# Patient Record
Sex: Male | Born: 1950
Health system: Southern US, Community
[De-identification: ages and names within clinical notes are randomized; demographics above are authoritative.]

## PROBLEM LIST (undated history)

## (undated) DIAGNOSIS — J302 Other seasonal allergic rhinitis: Secondary | ICD-10-CM

## (undated) DIAGNOSIS — H269 Unspecified cataract: Secondary | ICD-10-CM

## (undated) HISTORY — DX: Unspecified cataract: H26.9

## (undated) HISTORY — DX: Other seasonal allergic rhinitis: J30.2

---

## 2004-11-21 ENCOUNTER — Ambulatory Visit: Payer: Self-pay | Admitting: Family Medicine

## 2005-03-23 ENCOUNTER — Ambulatory Visit: Payer: Self-pay | Admitting: Family Medicine

## 2005-10-18 ENCOUNTER — Ambulatory Visit: Payer: Self-pay | Admitting: Family Medicine

## 2005-12-03 ENCOUNTER — Ambulatory Visit: Payer: Self-pay | Admitting: Family Medicine

## 2005-12-10 ENCOUNTER — Ambulatory Visit: Payer: Self-pay | Admitting: Family Medicine

## 2006-01-18 ENCOUNTER — Ambulatory Visit: Payer: Self-pay | Admitting: Family Medicine

## 2006-10-23 ENCOUNTER — Ambulatory Visit: Payer: Self-pay | Admitting: Family Medicine

## 2008-02-06 ENCOUNTER — Ambulatory Visit: Payer: Self-pay | Admitting: Gastroenterology

## 2008-02-18 ENCOUNTER — Ambulatory Visit: Payer: Self-pay | Admitting: Gastroenterology

## 2016-02-01 DIAGNOSIS — H524 Presbyopia: Secondary | ICD-10-CM | POA: Diagnosis not present

## 2016-02-01 DIAGNOSIS — H2511 Age-related nuclear cataract, right eye: Secondary | ICD-10-CM | POA: Diagnosis not present

## 2016-02-01 DIAGNOSIS — H2512 Age-related nuclear cataract, left eye: Secondary | ICD-10-CM | POA: Diagnosis not present

## 2016-02-01 DIAGNOSIS — H25011 Cortical age-related cataract, right eye: Secondary | ICD-10-CM | POA: Diagnosis not present

## 2016-02-01 DIAGNOSIS — D3132 Benign neoplasm of left choroid: Secondary | ICD-10-CM | POA: Diagnosis not present

## 2016-02-01 DIAGNOSIS — H25012 Cortical age-related cataract, left eye: Secondary | ICD-10-CM | POA: Diagnosis not present

## 2016-02-13 DIAGNOSIS — R7301 Impaired fasting glucose: Secondary | ICD-10-CM | POA: Diagnosis not present

## 2016-02-13 DIAGNOSIS — E784 Other hyperlipidemia: Secondary | ICD-10-CM | POA: Diagnosis not present

## 2016-02-13 DIAGNOSIS — Z125 Encounter for screening for malignant neoplasm of prostate: Secondary | ICD-10-CM | POA: Diagnosis not present

## 2016-02-14 DIAGNOSIS — H2511 Age-related nuclear cataract, right eye: Secondary | ICD-10-CM | POA: Diagnosis not present

## 2016-02-14 DIAGNOSIS — H2512 Age-related nuclear cataract, left eye: Secondary | ICD-10-CM | POA: Diagnosis not present

## 2016-02-21 ENCOUNTER — Other Ambulatory Visit: Payer: Self-pay | Admitting: Internal Medicine

## 2016-02-21 DIAGNOSIS — Z0000 Encounter for general adult medical examination without abnormal findings: Secondary | ICD-10-CM | POA: Diagnosis not present

## 2016-02-21 DIAGNOSIS — Z139 Encounter for screening, unspecified: Secondary | ICD-10-CM

## 2016-02-21 DIAGNOSIS — E784 Other hyperlipidemia: Secondary | ICD-10-CM | POA: Diagnosis not present

## 2016-02-21 DIAGNOSIS — F17200 Nicotine dependence, unspecified, uncomplicated: Secondary | ICD-10-CM | POA: Diagnosis not present

## 2016-02-21 DIAGNOSIS — N529 Male erectile dysfunction, unspecified: Secondary | ICD-10-CM | POA: Diagnosis not present

## 2016-02-21 DIAGNOSIS — Z6828 Body mass index (BMI) 28.0-28.9, adult: Secondary | ICD-10-CM | POA: Diagnosis not present

## 2016-02-21 DIAGNOSIS — Z23 Encounter for immunization: Secondary | ICD-10-CM | POA: Diagnosis not present

## 2016-02-21 DIAGNOSIS — H02409 Unspecified ptosis of unspecified eyelid: Secondary | ICD-10-CM | POA: Diagnosis not present

## 2016-02-21 DIAGNOSIS — R7301 Impaired fasting glucose: Secondary | ICD-10-CM | POA: Diagnosis not present

## 2016-02-21 DIAGNOSIS — Z1389 Encounter for screening for other disorder: Secondary | ICD-10-CM | POA: Diagnosis not present

## 2016-02-23 DIAGNOSIS — Z1212 Encounter for screening for malignant neoplasm of rectum: Secondary | ICD-10-CM | POA: Diagnosis not present

## 2016-02-27 ENCOUNTER — Encounter: Payer: Self-pay | Admitting: Gastroenterology

## 2016-03-02 ENCOUNTER — Ambulatory Visit
Admission: RE | Admit: 2016-03-02 | Discharge: 2016-03-02 | Disposition: A | Discharge status: Home / Self Care | Payer: PPO | Source: Ambulatory Visit | Attending: Internal Medicine | Admitting: Internal Medicine

## 2016-03-02 DIAGNOSIS — Z139 Encounter for screening, unspecified: Secondary | ICD-10-CM

## 2016-03-02 DIAGNOSIS — Z87891 Personal history of nicotine dependence: Secondary | ICD-10-CM | POA: Diagnosis not present

## 2016-03-08 DIAGNOSIS — H25011 Cortical age-related cataract, right eye: Secondary | ICD-10-CM | POA: Diagnosis not present

## 2016-03-08 DIAGNOSIS — H2511 Age-related nuclear cataract, right eye: Secondary | ICD-10-CM | POA: Diagnosis not present

## 2016-04-08 IMAGING — US US ABDOMINAL AORTA SCREENING AAA
1 series · 11 of 11 positions shown · non-contrast
Comparison: None.

CLINICAL DATA: History of smoking. Evaluate for abdominal aortic
aneurysm.

EXAM:
ULTRASOUND OF ABDOMINAL AORTA
TECHNIQUE: Ultrasound examination of the abdominal aorta was performed to
evaluate for abdominal aortic aneurysm.

[Series 1: us abdominal aorta screening aaa · 0.28mm/px · 11 of 11 slices shown]
[im 1/11]
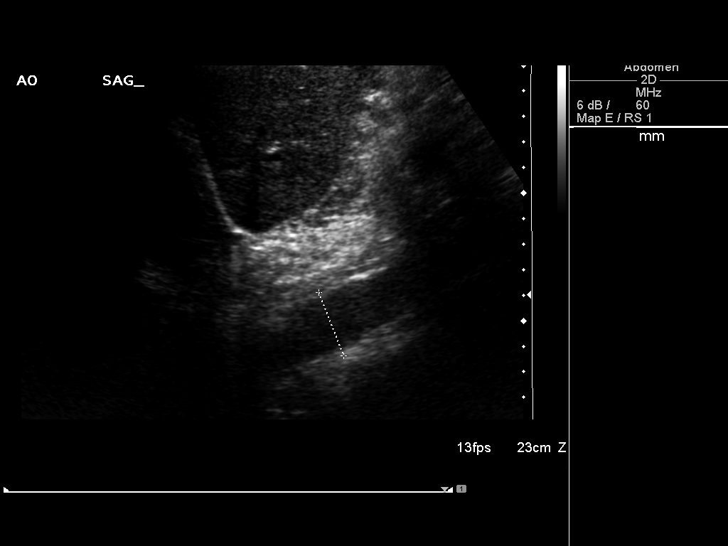
[im 2/11]
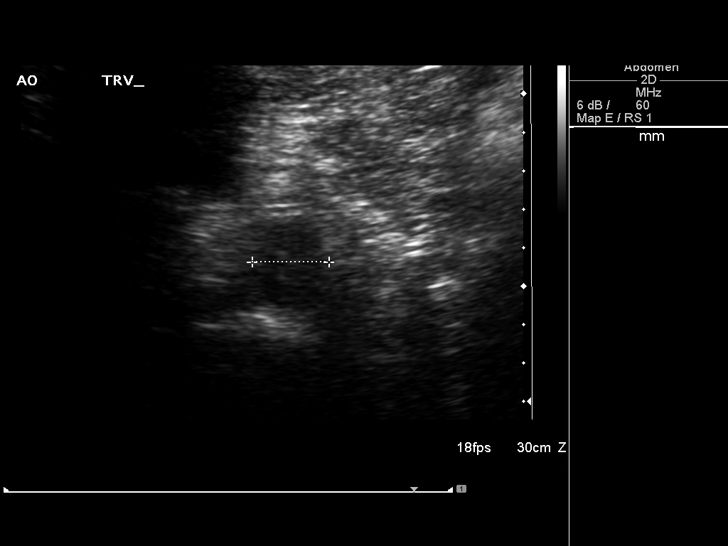
[im 3/11]
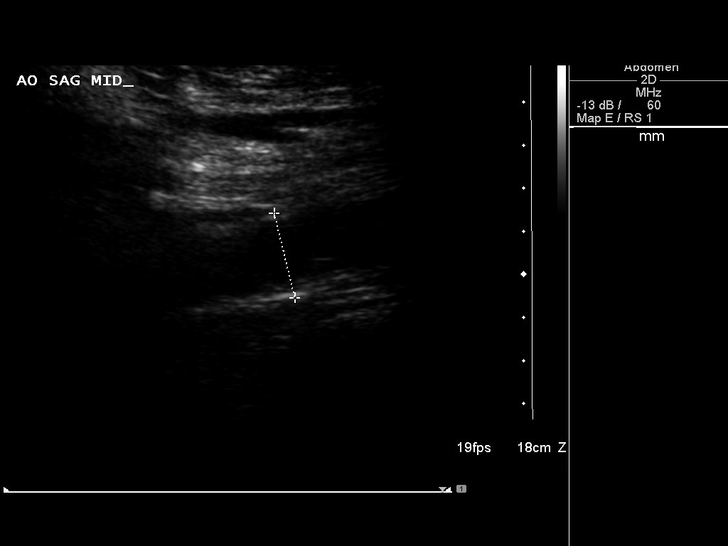
[im 4/11]
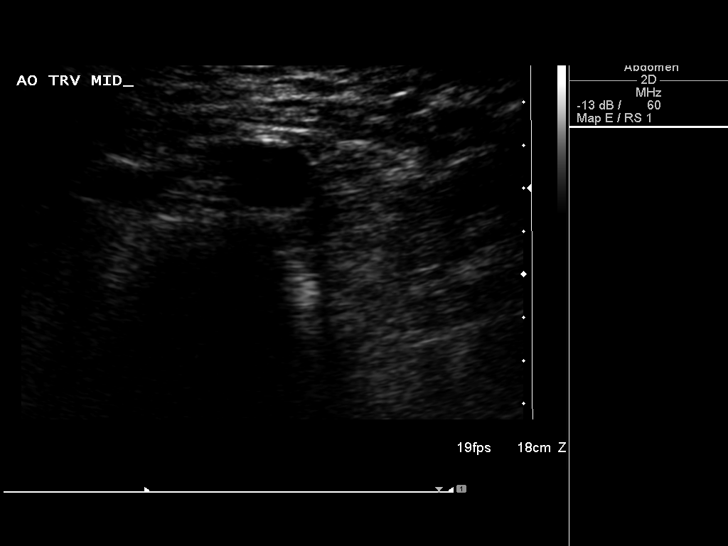
[im 5/11]
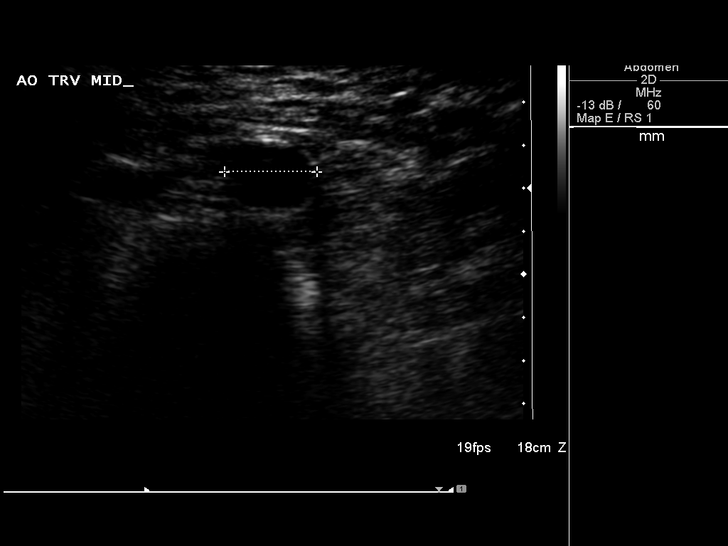
[im 6/11]
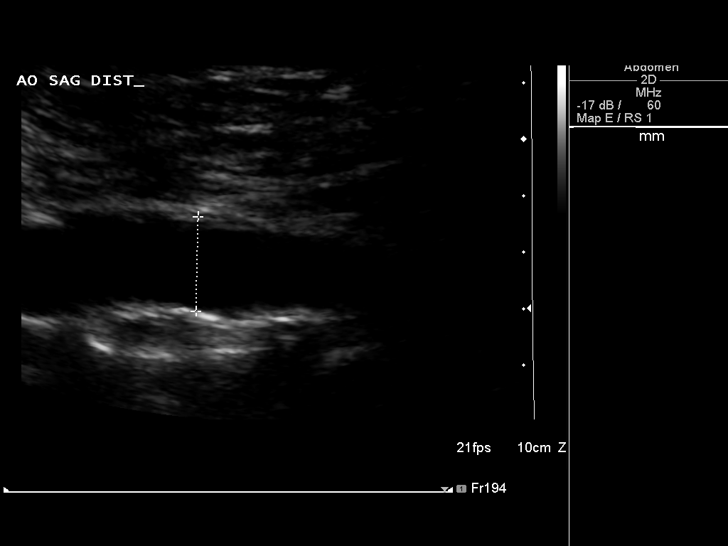
[im 7/11]
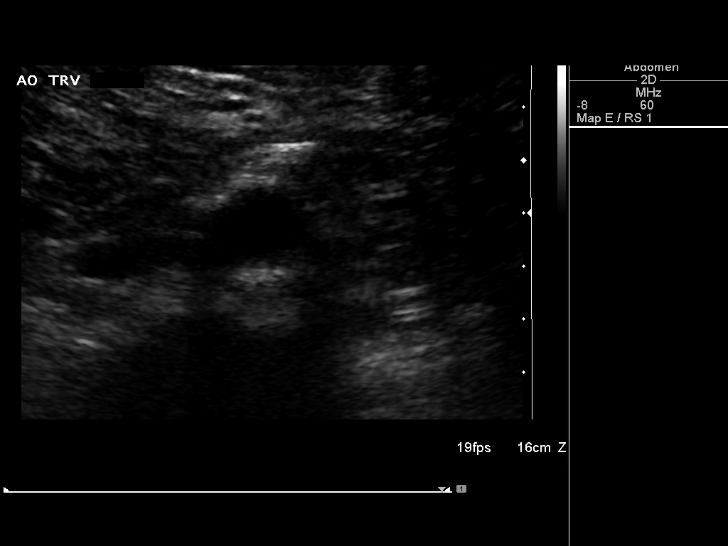
[im 8/11]
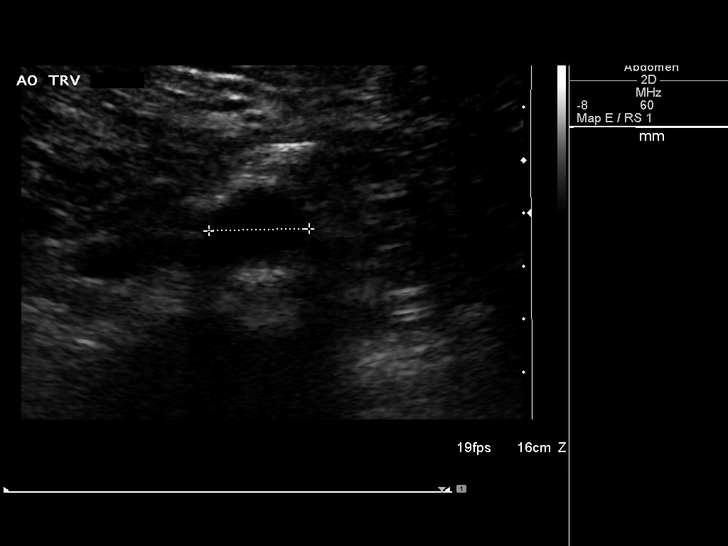
[im 9/11]
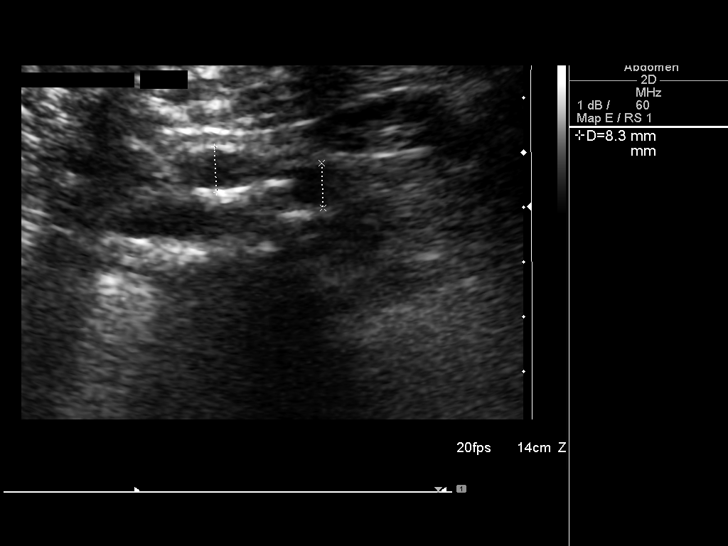
[im 10/11]
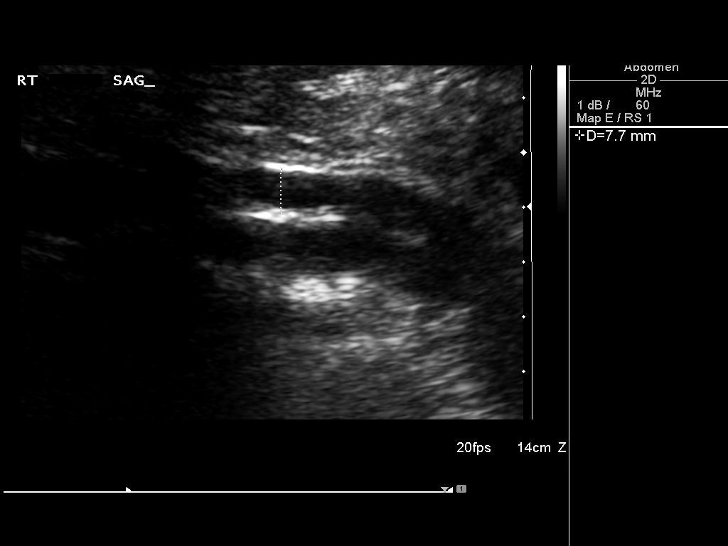
[im 11/11]
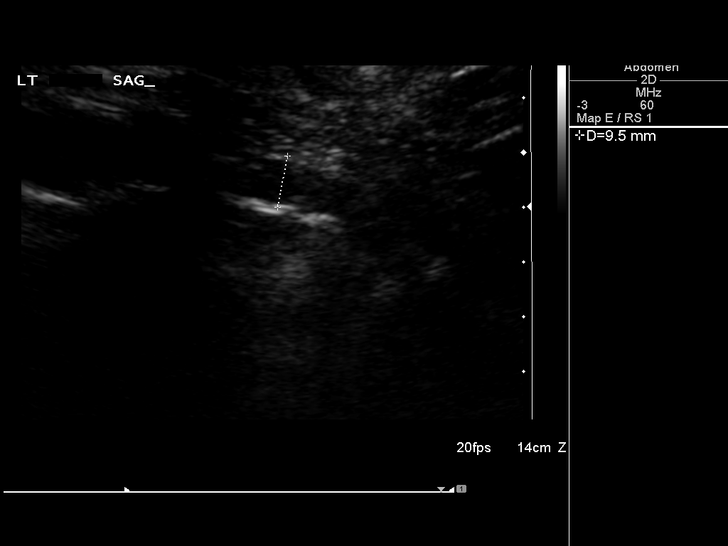

[11 of 11 positions shown; findings below may reference images not displayed]

FINDINGS: Abdominal Aorta

No evidence of abdominal aortic aneurysm was measurements as
follows:

Proximal:  2.7 x 2.0 cm.

Mid:  2.0 x 2.2 cm.

Distal:  1.7 x 1.9 cm.

Right common iliac artery:  0.8 x 0.8 cm.

Left Common iliac artery:  1.0 x 0.8 cm.
IMPRESSION: No evidence of abdominal aortic aneurysm.

## 2016-04-24 DIAGNOSIS — H2511 Age-related nuclear cataract, right eye: Secondary | ICD-10-CM | POA: Diagnosis not present

## 2016-06-15 DIAGNOSIS — Z961 Presence of intraocular lens: Secondary | ICD-10-CM | POA: Diagnosis not present

## 2016-11-06 DIAGNOSIS — L821 Other seborrheic keratosis: Secondary | ICD-10-CM | POA: Diagnosis not present

## 2016-11-06 DIAGNOSIS — L570 Actinic keratosis: Secondary | ICD-10-CM | POA: Diagnosis not present

## 2016-11-06 DIAGNOSIS — L308 Other specified dermatitis: Secondary | ICD-10-CM | POA: Diagnosis not present

## 2016-11-06 DIAGNOSIS — L918 Other hypertrophic disorders of the skin: Secondary | ICD-10-CM | POA: Diagnosis not present

## 2016-12-05 DIAGNOSIS — H1851 Endothelial corneal dystrophy: Secondary | ICD-10-CM | POA: Diagnosis not present

## 2016-12-05 DIAGNOSIS — Z961 Presence of intraocular lens: Secondary | ICD-10-CM | POA: Diagnosis not present

## 2016-12-05 DIAGNOSIS — D3132 Benign neoplasm of left choroid: Secondary | ICD-10-CM | POA: Diagnosis not present

## 2016-12-05 DIAGNOSIS — H01003 Unspecified blepharitis right eye, unspecified eyelid: Secondary | ICD-10-CM | POA: Diagnosis not present

## 2016-12-24 HISTORY — PX: CATARACT EXTRACTION, BILATERAL: SHX1313

## 2017-02-15 DIAGNOSIS — Z125 Encounter for screening for malignant neoplasm of prostate: Secondary | ICD-10-CM | POA: Diagnosis not present

## 2017-02-15 DIAGNOSIS — R7301 Impaired fasting glucose: Secondary | ICD-10-CM | POA: Diagnosis not present

## 2017-02-15 DIAGNOSIS — E784 Other hyperlipidemia: Secondary | ICD-10-CM | POA: Diagnosis not present

## 2017-02-15 DIAGNOSIS — Z0000 Encounter for general adult medical examination without abnormal findings: Secondary | ICD-10-CM | POA: Diagnosis not present

## 2017-02-22 DIAGNOSIS — Z6829 Body mass index (BMI) 29.0-29.9, adult: Secondary | ICD-10-CM | POA: Diagnosis not present

## 2017-02-22 DIAGNOSIS — F17200 Nicotine dependence, unspecified, uncomplicated: Secondary | ICD-10-CM | POA: Diagnosis not present

## 2017-02-22 DIAGNOSIS — B351 Tinea unguium: Secondary | ICD-10-CM | POA: Diagnosis not present

## 2017-02-22 DIAGNOSIS — Z0000 Encounter for general adult medical examination without abnormal findings: Secondary | ICD-10-CM | POA: Diagnosis not present

## 2017-02-22 DIAGNOSIS — E784 Other hyperlipidemia: Secondary | ICD-10-CM | POA: Diagnosis not present

## 2017-02-22 DIAGNOSIS — Z125 Encounter for screening for malignant neoplasm of prostate: Secondary | ICD-10-CM | POA: Diagnosis not present

## 2017-02-22 DIAGNOSIS — R7301 Impaired fasting glucose: Secondary | ICD-10-CM | POA: Diagnosis not present

## 2017-02-22 DIAGNOSIS — N528 Other male erectile dysfunction: Secondary | ICD-10-CM | POA: Diagnosis not present

## 2017-02-22 DIAGNOSIS — Z1389 Encounter for screening for other disorder: Secondary | ICD-10-CM | POA: Diagnosis not present

## 2017-02-26 ENCOUNTER — Other Ambulatory Visit: Payer: Self-pay | Admitting: Internal Medicine

## 2017-02-26 DIAGNOSIS — F17200 Nicotine dependence, unspecified, uncomplicated: Secondary | ICD-10-CM

## 2017-03-14 DIAGNOSIS — Z1212 Encounter for screening for malignant neoplasm of rectum: Secondary | ICD-10-CM | POA: Diagnosis not present

## 2017-09-07 DIAGNOSIS — Z23 Encounter for immunization: Secondary | ICD-10-CM | POA: Diagnosis not present

## 2017-12-05 DIAGNOSIS — H01003 Unspecified blepharitis right eye, unspecified eyelid: Secondary | ICD-10-CM | POA: Diagnosis not present

## 2017-12-05 DIAGNOSIS — H1851 Endothelial corneal dystrophy: Secondary | ICD-10-CM | POA: Diagnosis not present

## 2017-12-05 DIAGNOSIS — D3132 Benign neoplasm of left choroid: Secondary | ICD-10-CM | POA: Diagnosis not present

## 2017-12-05 DIAGNOSIS — Z961 Presence of intraocular lens: Secondary | ICD-10-CM | POA: Diagnosis not present

## 2018-02-05 DIAGNOSIS — H1851 Endothelial corneal dystrophy: Secondary | ICD-10-CM | POA: Diagnosis not present

## 2018-02-05 DIAGNOSIS — Z961 Presence of intraocular lens: Secondary | ICD-10-CM | POA: Diagnosis not present

## 2018-02-20 DIAGNOSIS — Z125 Encounter for screening for malignant neoplasm of prostate: Secondary | ICD-10-CM | POA: Diagnosis not present

## 2018-02-20 DIAGNOSIS — E7849 Other hyperlipidemia: Secondary | ICD-10-CM | POA: Diagnosis not present

## 2018-02-20 DIAGNOSIS — R82998 Other abnormal findings in urine: Secondary | ICD-10-CM | POA: Diagnosis not present

## 2018-02-20 DIAGNOSIS — R7301 Impaired fasting glucose: Secondary | ICD-10-CM | POA: Diagnosis not present

## 2018-02-25 DIAGNOSIS — Z23 Encounter for immunization: Secondary | ICD-10-CM | POA: Diagnosis not present

## 2018-02-25 DIAGNOSIS — R7301 Impaired fasting glucose: Secondary | ICD-10-CM | POA: Diagnosis not present

## 2018-02-25 DIAGNOSIS — N529 Male erectile dysfunction, unspecified: Secondary | ICD-10-CM | POA: Diagnosis not present

## 2018-02-25 DIAGNOSIS — Z0000 Encounter for general adult medical examination without abnormal findings: Secondary | ICD-10-CM | POA: Diagnosis not present

## 2018-02-25 DIAGNOSIS — Z1389 Encounter for screening for other disorder: Secondary | ICD-10-CM | POA: Diagnosis not present

## 2018-02-25 DIAGNOSIS — M720 Palmar fascial fibromatosis [Dupuytren]: Secondary | ICD-10-CM | POA: Diagnosis not present

## 2018-02-25 DIAGNOSIS — Z6830 Body mass index (BMI) 30.0-30.9, adult: Secondary | ICD-10-CM | POA: Diagnosis not present

## 2018-02-25 DIAGNOSIS — F17200 Nicotine dependence, unspecified, uncomplicated: Secondary | ICD-10-CM | POA: Diagnosis not present

## 2018-02-25 DIAGNOSIS — E7849 Other hyperlipidemia: Secondary | ICD-10-CM | POA: Diagnosis not present

## 2018-02-25 DIAGNOSIS — H1852 Epithelial (juvenile) corneal dystrophy: Secondary | ICD-10-CM | POA: Diagnosis not present

## 2018-02-25 DIAGNOSIS — B351 Tinea unguium: Secondary | ICD-10-CM | POA: Diagnosis not present

## 2018-02-26 ENCOUNTER — Other Ambulatory Visit: Payer: Self-pay | Admitting: Internal Medicine

## 2018-02-26 ENCOUNTER — Encounter: Payer: Self-pay | Admitting: Gastroenterology

## 2018-02-26 DIAGNOSIS — F17210 Nicotine dependence, cigarettes, uncomplicated: Secondary | ICD-10-CM

## 2018-02-26 DIAGNOSIS — Z1212 Encounter for screening for malignant neoplasm of rectum: Secondary | ICD-10-CM | POA: Diagnosis not present

## 2018-03-14 DIAGNOSIS — Z961 Presence of intraocular lens: Secondary | ICD-10-CM | POA: Diagnosis not present

## 2018-03-14 DIAGNOSIS — H1851 Endothelial corneal dystrophy: Secondary | ICD-10-CM | POA: Diagnosis not present

## 2018-04-16 ENCOUNTER — Other Ambulatory Visit: Payer: Self-pay

## 2018-04-16 ENCOUNTER — Ambulatory Visit (AMBULATORY_SURGERY_CENTER): Payer: Self-pay | Admitting: *Deleted

## 2018-04-16 ENCOUNTER — Encounter: Payer: Self-pay | Admitting: Gastroenterology

## 2018-04-16 VITALS — Ht 67.0 in | Wt 185.8 lb

## 2018-04-16 DIAGNOSIS — Z1211 Encounter for screening for malignant neoplasm of colon: Secondary | ICD-10-CM

## 2018-04-16 NOTE — Progress Notes (Signed)
Denies allergies to eggs or soy products. Denies complications with sedation or anesthesia. Denies O2 use. Denies use of diet or weight loss medications.  Emmi instructions given for colonoscopy.  

## 2018-04-30 ENCOUNTER — Other Ambulatory Visit: Payer: Self-pay

## 2018-04-30 ENCOUNTER — Ambulatory Visit (AMBULATORY_SURGERY_CENTER): Payer: PPO | Admitting: Gastroenterology

## 2018-04-30 ENCOUNTER — Encounter: Payer: Self-pay | Admitting: Gastroenterology

## 2018-04-30 VITALS — BP 112/71 | HR 69 | Temp 98.0°F | Resp 13 | Ht 67.0 in | Wt 185.0 lb

## 2018-04-30 DIAGNOSIS — D122 Benign neoplasm of ascending colon: Secondary | ICD-10-CM | POA: Diagnosis not present

## 2018-04-30 DIAGNOSIS — D123 Benign neoplasm of transverse colon: Secondary | ICD-10-CM | POA: Diagnosis not present

## 2018-04-30 DIAGNOSIS — Z1211 Encounter for screening for malignant neoplasm of colon: Secondary | ICD-10-CM | POA: Diagnosis not present

## 2018-04-30 MED ORDER — SODIUM CHLORIDE 0.9 % IV SOLN: 500.0000 mL | Freq: Once | INTRAVENOUS | Status: AC

## 2018-04-30 NOTE — Progress Notes (Signed)
Called to room to assist during endoscopic procedure.  Patient ID and intended procedure confirmed with present staff. Received instructions for my participation in the procedure from the performing physician.  

## 2018-04-30 NOTE — Progress Notes (Signed)
To PACU, VSS. Report to [email or web address] 

## 2018-04-30 NOTE — Patient Instructions (Signed)
Thank you for allowing [place] to care for you today!  Await Pathology results  Handouts given for Polyps, Diverticulosis, and Hemorrhoids.    YOU HAD AN ENDOSCOPIC PROCEDURE TODAY AT [facility]:   Refer to the procedure report that was given to you for any specific questions about what was found during the examination.  If the procedure report does not answer your questions, please call your gastroenterologist to clarify.  If you requested that your care partner not be given the details of your procedure findings, then the procedure report has been included in a sealed envelope for you to review at your convenience later.  YOU SHOULD EXPECT: Some feelings of bloating in the abdomen. Passage of more gas than usual.  Walking can help get rid of the air that was put into your GI tract during the procedure and reduce the bloating. If you had a lower endoscopy (such as a colonoscopy or flexible sigmoidoscopy) you may notice spotting of blood in your stool or on the toilet paper. If you underwent a bowel prep for your procedure, you may not have a normal bowel movement for a few days.  Please Note:  You might notice some irritation and congestion in your nose or some drainage.  This is from the oxygen used during your procedure.  There is no need for concern and it should clear up in a day or so.  SYMPTOMS TO REPORT IMMEDIATELY:   Following lower endoscopy (colonoscopy or flexible sigmoidoscopy):  Excessive amounts of blood in the stool  Significant tenderness or worsening of abdominal pains  Swelling of the abdomen that is new, acute  Fever of 100F or higher    For urgent or emergent issues, a gastroenterologist can be reached at any hour by calling [phone].   DIET:  We do recommend a small meal at first, but then you may proceed to your regular diet.  Drink plenty of fluids but you should avoid alcoholic beverages for 24 hours.  ACTIVITY:  You should plan to take it  easy for the rest of today and you should NOT DRIVE or use heavy machinery until tomorrow (because of the sedation medicines used during the test).    FOLLOW UP: Our staff will call the number listed on your records the next business day following your procedure to check on you and address any questions or concerns that you may have regarding the information given to you following your procedure. If we do not reach you, we will leave a message.  However, if you are feeling well and you are not experiencing any problems, there is no need to return our call.  We will assume that you have returned to your regular daily activities without incident.  If any biopsies were taken you will be contacted by phone or by letter within the next 1-3 weeks.  Please call us at [phone] if you have not heard about the biopsies in 3 weeks.    SIGNATURES/CONFIDENTIALITY: You and/or your care partner have signed paperwork which will be entered into your electronic medical record.  These signatures attest to the fact that that the information above on your After Visit Summary has been reviewed and is understood.  Full responsibility of the confidentiality of this discharge information lies with you and/or your care-partner.

## 2018-04-30 NOTE — Op Note (Signed)
[facility] Patient Name: Derek Turner Procedure Date: 04/30/2018 9:45 AM MRN: [id number] Endoscopist: Remo Lipps P. Cortland Crehan MD, MD Age: 67 Referring MD:  Date of Birth: 05/17/1951 Gender: Male Account #: [id number] Procedure:                Colonoscopy Indications:              Screening for colorectal malignant neoplasm Medicines:                Monitored Anesthesia Care Procedure:                Pre-Anesthesia Assessment:                           - Prior to the procedure, a History and Physical                            was performed, and patient medications and                            allergies were reviewed. The patient's tolerance of                            previous anesthesia was also reviewed. The risks                            and benefits of the procedure and the sedation                            options and risks were discussed with the patient.                            All questions were answered, and informed consent                            was obtained. Prior Anticoagulants: The patient has                            taken no previous anticoagulant or antiplatelet                            agents. ASA Grade Assessment: II - A patient with                            mild systemic disease. After reviewing the risks                            and benefits, the patient was deemed in                            satisfactory condition to undergo the procedure.                           After obtaining informed consent, the colonoscope  was passed under direct vision. Throughout the                            procedure, the patient's blood pressure, pulse, and                            oxygen saturations were monitored continuously. The                            Colonoscope was introduced through the anus and                            advanced to the the cecum, identified by                            appendiceal orifice  and ileocecal valve. The                            colonoscopy was performed without difficulty. The                            patient tolerated the procedure well. The quality                            of the bowel preparation was adequate. The                            ileocecal valve, appendiceal orifice, and rectum                            were photographed. Scope In: 9:50:56 AM Scope Out: 10:08:39 AM Scope Withdrawal Time: 0 hours 12 minutes 57 seconds  Total Procedure Duration: 0 hours 17 minutes 43 seconds  Findings:                 The perianal and digital rectal examinations were                            normal.                           A 5 mm polyp was found in the ascending colon. The                            polyp was sessile. The polyp was removed with a                            cold snare. Resection and retrieval were complete.                           A 4 mm polyp was found in the transverse colon. The                            polyp was sessile. The polyp was removed with a  cold snare. Resection and retrieval were complete.                           Multiple small-mouthed diverticula were found in                            the sigmoid colon.                           Internal hemorrhoids were found during                            retroflexion. The hemorrhoids were small.                           The exam was otherwise without abnormality. Complications:            No immediate complications. Estimated blood loss:                            Minimal. Estimated Blood Loss:     Estimated blood loss was minimal. Impression:               - One 5 mm polyp in the ascending colon, removed                            with a cold snare. Resected and retrieved.                           - One 4 mm polyp in the transverse colon, removed                            with a cold snare. Resected and retrieved.                           -  Diverticulosis in the sigmoid colon.                           - Internal hemorrhoids.                           - The examination was otherwise normal. Recommendation:           - Patient has a contact number available for                            emergencies. The signs and symptoms of potential                            delayed complications were discussed with the                            patient. Return to normal activities tomorrow.                            Written discharge instructions were provided to the  patient.                           - Resume previous diet.                           - Continue present medications.                           - Await pathology results.                           - Repeat colonoscopy for surveillance based on                            pathology results. Remo Lipps P. Zohair Epp MD, MD 04/30/2018 10:11:54 AM This report has been signed electronically.

## 2018-04-30 NOTE — Progress Notes (Signed)
Pt's states no medical or surgical changes since previsit or office visit. 

## 2018-05-01 ENCOUNTER — Telehealth: Payer: Self-pay

## 2018-05-01 NOTE — Telephone Encounter (Signed)
Attempted to reach patient for post-procedure f/u call. No answer. Left message that we will make another attempt to reach him again later today and for him to please not hesitate to call us if he has any questions/concerns regarding his care. 

## 2018-05-01 NOTE — Telephone Encounter (Signed)
Attempted to reach pt. Following endoscopic procedure 04/30/2018.  LM on pt.ans. Machine to call us if he has any questions or concerns.

## 2018-05-05 ENCOUNTER — Encounter: Payer: Self-pay | Admitting: Gastroenterology

## 2018-07-29 DIAGNOSIS — J181 Lobar pneumonia, unspecified organism: Secondary | ICD-10-CM | POA: Diagnosis not present

## 2018-07-29 DIAGNOSIS — R05 Cough: Secondary | ICD-10-CM | POA: Diagnosis not present

## 2018-07-29 DIAGNOSIS — Z6829 Body mass index (BMI) 29.0-29.9, adult: Secondary | ICD-10-CM | POA: Diagnosis not present

## 2018-09-27 DIAGNOSIS — Z23 Encounter for immunization: Secondary | ICD-10-CM | POA: Diagnosis not present

## 2019-01-08 DIAGNOSIS — Z961 Presence of intraocular lens: Secondary | ICD-10-CM | POA: Diagnosis not present

## 2019-01-08 DIAGNOSIS — H1851 Endothelial corneal dystrophy: Secondary | ICD-10-CM | POA: Diagnosis not present

## 2019-01-08 DIAGNOSIS — D3132 Benign neoplasm of left choroid: Secondary | ICD-10-CM | POA: Diagnosis not present

## 2019-01-08 DIAGNOSIS — H01003 Unspecified blepharitis right eye, unspecified eyelid: Secondary | ICD-10-CM | POA: Diagnosis not present

## 2019-02-26 DIAGNOSIS — R82998 Other abnormal findings in urine: Secondary | ICD-10-CM | POA: Diagnosis not present

## 2019-02-26 DIAGNOSIS — E7849 Other hyperlipidemia: Secondary | ICD-10-CM | POA: Diagnosis not present

## 2019-02-26 DIAGNOSIS — R7301 Impaired fasting glucose: Secondary | ICD-10-CM | POA: Diagnosis not present

## 2019-03-05 DIAGNOSIS — F17200 Nicotine dependence, unspecified, uncomplicated: Secondary | ICD-10-CM | POA: Diagnosis not present

## 2019-03-05 DIAGNOSIS — R7301 Impaired fasting glucose: Secondary | ICD-10-CM | POA: Diagnosis not present

## 2019-03-05 DIAGNOSIS — H1852 Epithelial (juvenile) corneal dystrophy: Secondary | ICD-10-CM | POA: Diagnosis not present

## 2019-03-05 DIAGNOSIS — Z125 Encounter for screening for malignant neoplasm of prostate: Secondary | ICD-10-CM | POA: Diagnosis not present

## 2019-03-05 DIAGNOSIS — Z1331 Encounter for screening for depression: Secondary | ICD-10-CM | POA: Diagnosis not present

## 2019-03-05 DIAGNOSIS — N529 Male erectile dysfunction, unspecified: Secondary | ICD-10-CM | POA: Diagnosis not present

## 2019-03-05 DIAGNOSIS — Z0000 Encounter for general adult medical examination without abnormal findings: Secondary | ICD-10-CM | POA: Diagnosis not present

## 2019-03-05 DIAGNOSIS — E7849 Other hyperlipidemia: Secondary | ICD-10-CM | POA: Diagnosis not present

## 2019-03-09 ENCOUNTER — Other Ambulatory Visit: Payer: Self-pay | Admitting: Internal Medicine

## 2019-03-09 DIAGNOSIS — F17209 Nicotine dependence, unspecified, with unspecified nicotine-induced disorders: Secondary | ICD-10-CM

## 2019-03-10 DIAGNOSIS — Z1212 Encounter for screening for malignant neoplasm of rectum: Secondary | ICD-10-CM | POA: Diagnosis not present

## 2019-09-05 DIAGNOSIS — Z23 Encounter for immunization: Secondary | ICD-10-CM | POA: Diagnosis not present

## 2019-12-02 DIAGNOSIS — D485 Neoplasm of uncertain behavior of skin: Secondary | ICD-10-CM | POA: Diagnosis not present

## 2019-12-02 DIAGNOSIS — D1809 Hemangioma of other sites: Secondary | ICD-10-CM | POA: Diagnosis not present

## 2019-12-02 DIAGNOSIS — L821 Other seborrheic keratosis: Secondary | ICD-10-CM | POA: Diagnosis not present

## 2019-12-02 DIAGNOSIS — D1801 Hemangioma of skin and subcutaneous tissue: Secondary | ICD-10-CM | POA: Diagnosis not present

## 2019-12-02 DIAGNOSIS — L918 Other hypertrophic disorders of the skin: Secondary | ICD-10-CM | POA: Diagnosis not present

## 2019-12-02 DIAGNOSIS — B351 Tinea unguium: Secondary | ICD-10-CM | POA: Diagnosis not present

## 2020-01-12 ENCOUNTER — Ambulatory Visit: Payer: PPO | Attending: Internal Medicine

## 2020-01-12 DIAGNOSIS — Z23 Encounter for immunization: Secondary | ICD-10-CM | POA: Insufficient documentation

## 2020-01-12 NOTE — Progress Notes (Signed)
   Covid-19 Vaccination Clinic  Name:  Derek Turner    MRN: [id number] DOB: 1951-12-16  01/12/2020  Derek Turner was observed post Covid-19 immunization for 15 minutes without incidence. He was provided with Vaccine Information Sheet and instruction to access the V-Safe system.   Derek Turner was instructed to call 911 with any severe reactions post vaccine: Marland Kitchen Difficulty breathing  . Swelling of your face and throat  . A fast heartbeat  . A bad rash all over your body  . Dizziness and weakness    Immunizations Administered    Name Date Dose VIS Date Route   Pfizer COVID-19 Vaccine 01/12/2020  5:40 PM 0.3 mL 12/04/2019 Intramuscular   Manufacturer: [place]   Lot: [id number]   [place]: [id number]

## 2020-01-30 ENCOUNTER — Ambulatory Visit: Payer: PPO | Attending: Internal Medicine

## 2020-01-30 DIAGNOSIS — Z23 Encounter for immunization: Secondary | ICD-10-CM

## 2020-01-30 NOTE — Progress Notes (Signed)
   Covid-19 Vaccination Clinic  Name:  Derek Turner    MRN: [id number] DOB: [birth year]  01/30/2020  [name] was observed post Covid-19 immunization for 15 minutes without incidence. He was provided with Vaccine Information Sheet and instruction to access the V-Safe system.   [name] was instructed to call 911 with any severe reactions post vaccine: Marland Kitchen Difficulty breathing  . Swelling of your face and throat  . A fast heartbeat  . A bad rash all over your body  . Dizziness and weakness    Immunizations Administered    Name Date Dose VIS Date Route   Pfizer COVID-19 Vaccine 01/30/2020 10:03 AM 0.3 mL 12/04/2019 Intramuscular   Manufacturer: [place]   Lot: [id number]   [place]: [id number]

## 2020-02-03 DIAGNOSIS — Z961 Presence of intraocular lens: Secondary | ICD-10-CM | POA: Diagnosis not present

## 2020-02-03 DIAGNOSIS — H18513 Endothelial corneal dystrophy, bilateral: Secondary | ICD-10-CM | POA: Diagnosis not present

## 2020-02-05 ENCOUNTER — Ambulatory Visit: Payer: PPO

## 2020-02-26 DIAGNOSIS — Z20822 Contact with and (suspected) exposure to covid-19: Secondary | ICD-10-CM | POA: Diagnosis not present

## 2020-02-26 DIAGNOSIS — Z20828 Contact with and (suspected) exposure to other viral communicable diseases: Secondary | ICD-10-CM | POA: Diagnosis not present

## 2020-03-03 DIAGNOSIS — H18511 Endothelial corneal dystrophy, right eye: Secondary | ICD-10-CM | POA: Diagnosis not present

## 2020-03-03 DIAGNOSIS — H18519 Endothelial corneal dystrophy, unspecified eye: Secondary | ICD-10-CM | POA: Diagnosis not present

## 2020-03-07 DIAGNOSIS — Z48810 Encounter for surgical aftercare following surgery on the sense organs: Secondary | ICD-10-CM | POA: Diagnosis not present

## 2020-03-07 DIAGNOSIS — Z961 Presence of intraocular lens: Secondary | ICD-10-CM | POA: Diagnosis not present

## 2020-03-07 DIAGNOSIS — H18513 Endothelial corneal dystrophy, bilateral: Secondary | ICD-10-CM | POA: Diagnosis not present

## 2020-03-07 DIAGNOSIS — Z947 Corneal transplant status: Secondary | ICD-10-CM | POA: Diagnosis not present

## 2020-03-21 DIAGNOSIS — R7301 Impaired fasting glucose: Secondary | ICD-10-CM | POA: Diagnosis not present

## 2020-03-21 DIAGNOSIS — Z125 Encounter for screening for malignant neoplasm of prostate: Secondary | ICD-10-CM | POA: Diagnosis not present

## 2020-03-21 DIAGNOSIS — E7849 Other hyperlipidemia: Secondary | ICD-10-CM | POA: Diagnosis not present

## 2020-03-28 DIAGNOSIS — R7301 Impaired fasting glucose: Secondary | ICD-10-CM | POA: Diagnosis not present

## 2020-03-28 DIAGNOSIS — E785 Hyperlipidemia, unspecified: Secondary | ICD-10-CM | POA: Diagnosis not present

## 2020-03-28 DIAGNOSIS — N529 Male erectile dysfunction, unspecified: Secondary | ICD-10-CM | POA: Diagnosis not present

## 2020-03-28 DIAGNOSIS — Z1212 Encounter for screening for malignant neoplasm of rectum: Secondary | ICD-10-CM | POA: Diagnosis not present

## 2020-03-28 DIAGNOSIS — Z1331 Encounter for screening for depression: Secondary | ICD-10-CM | POA: Diagnosis not present

## 2020-03-28 DIAGNOSIS — Z0000 Encounter for general adult medical examination without abnormal findings: Secondary | ICD-10-CM | POA: Diagnosis not present

## 2020-03-28 DIAGNOSIS — F17200 Nicotine dependence, unspecified, uncomplicated: Secondary | ICD-10-CM | POA: Diagnosis not present

## 2020-03-28 DIAGNOSIS — Z1339 Encounter for screening examination for other mental health and behavioral disorders: Secondary | ICD-10-CM | POA: Diagnosis not present

## 2020-03-30 ENCOUNTER — Other Ambulatory Visit: Payer: Self-pay | Admitting: Internal Medicine

## 2020-03-30 DIAGNOSIS — Z0000 Encounter for general adult medical examination without abnormal findings: Secondary | ICD-10-CM

## 2020-06-02 DIAGNOSIS — H18512 Endothelial corneal dystrophy, left eye: Secondary | ICD-10-CM | POA: Diagnosis not present

## 2020-06-06 DIAGNOSIS — Z48810 Encounter for surgical aftercare following surgery on the sense organs: Secondary | ICD-10-CM | POA: Diagnosis not present

## 2020-06-06 DIAGNOSIS — Z947 Corneal transplant status: Secondary | ICD-10-CM | POA: Diagnosis not present

## 2020-06-06 DIAGNOSIS — Z961 Presence of intraocular lens: Secondary | ICD-10-CM | POA: Diagnosis not present

## 2020-08-04 DIAGNOSIS — Z947 Corneal transplant status: Secondary | ICD-10-CM | POA: Diagnosis not present

## 2020-08-04 DIAGNOSIS — Z9841 Cataract extraction status, right eye: Secondary | ICD-10-CM | POA: Diagnosis not present

## 2020-08-04 DIAGNOSIS — R0683 Snoring: Secondary | ICD-10-CM | POA: Diagnosis not present

## 2020-08-04 DIAGNOSIS — H47011 Ischemic optic neuropathy, right eye: Secondary | ICD-10-CM | POA: Diagnosis not present

## 2020-08-04 DIAGNOSIS — Z9842 Cataract extraction status, left eye: Secondary | ICD-10-CM | POA: Diagnosis not present

## 2020-08-04 DIAGNOSIS — Z961 Presence of intraocular lens: Secondary | ICD-10-CM | POA: Diagnosis not present

## 2020-08-12 DIAGNOSIS — Z961 Presence of intraocular lens: Secondary | ICD-10-CM | POA: Diagnosis not present

## 2020-08-12 DIAGNOSIS — Z947 Corneal transplant status: Secondary | ICD-10-CM | POA: Diagnosis not present

## 2020-08-12 DIAGNOSIS — G4730 Sleep apnea, unspecified: Secondary | ICD-10-CM | POA: Diagnosis not present

## 2020-08-12 DIAGNOSIS — H47011 Ischemic optic neuropathy, right eye: Secondary | ICD-10-CM | POA: Diagnosis not present

## 2020-09-24 DIAGNOSIS — Z23 Encounter for immunization: Secondary | ICD-10-CM | POA: Diagnosis not present

## 2020-11-21 DIAGNOSIS — Z9842 Cataract extraction status, left eye: Secondary | ICD-10-CM | POA: Diagnosis not present

## 2020-11-21 DIAGNOSIS — Z961 Presence of intraocular lens: Secondary | ICD-10-CM | POA: Diagnosis not present

## 2020-11-21 DIAGNOSIS — Z8669 Personal history of other diseases of the nervous system and sense organs: Secondary | ICD-10-CM | POA: Diagnosis not present

## 2020-11-21 DIAGNOSIS — H47011 Ischemic optic neuropathy, right eye: Secondary | ICD-10-CM | POA: Diagnosis not present

## 2020-11-21 DIAGNOSIS — Z947 Corneal transplant status: Secondary | ICD-10-CM | POA: Diagnosis not present

## 2020-11-21 DIAGNOSIS — Z9841 Cataract extraction status, right eye: Secondary | ICD-10-CM | POA: Diagnosis not present

## 2020-11-21 DIAGNOSIS — H18513 Endothelial corneal dystrophy, bilateral: Secondary | ICD-10-CM | POA: Diagnosis not present

## 2020-12-28 DIAGNOSIS — H18513 Endothelial corneal dystrophy, bilateral: Secondary | ICD-10-CM | POA: Diagnosis not present

## 2020-12-28 DIAGNOSIS — H47011 Ischemic optic neuropathy, right eye: Secondary | ICD-10-CM | POA: Diagnosis not present

## 2020-12-28 DIAGNOSIS — Z961 Presence of intraocular lens: Secondary | ICD-10-CM | POA: Diagnosis not present

## 2020-12-28 DIAGNOSIS — Z947 Corneal transplant status: Secondary | ICD-10-CM | POA: Diagnosis not present

## 2021-02-23 DIAGNOSIS — Z961 Presence of intraocular lens: Secondary | ICD-10-CM | POA: Diagnosis not present

## 2021-02-23 DIAGNOSIS — Z947 Corneal transplant status: Secondary | ICD-10-CM | POA: Diagnosis not present

## 2021-02-23 DIAGNOSIS — H47011 Ischemic optic neuropathy, right eye: Secondary | ICD-10-CM | POA: Diagnosis not present

## 2021-04-17 DIAGNOSIS — E785 Hyperlipidemia, unspecified: Secondary | ICD-10-CM | POA: Diagnosis not present

## 2021-04-17 DIAGNOSIS — R7301 Impaired fasting glucose: Secondary | ICD-10-CM | POA: Diagnosis not present

## 2021-04-17 DIAGNOSIS — Z125 Encounter for screening for malignant neoplasm of prostate: Secondary | ICD-10-CM | POA: Diagnosis not present

## 2021-04-24 DIAGNOSIS — Z1212 Encounter for screening for malignant neoplasm of rectum: Secondary | ICD-10-CM | POA: Diagnosis not present

## 2021-04-24 DIAGNOSIS — Z0000 Encounter for general adult medical examination without abnormal findings: Secondary | ICD-10-CM | POA: Diagnosis not present

## 2021-04-24 DIAGNOSIS — F17200 Nicotine dependence, unspecified, uncomplicated: Secondary | ICD-10-CM | POA: Diagnosis not present

## 2021-04-24 DIAGNOSIS — Z1331 Encounter for screening for depression: Secondary | ICD-10-CM | POA: Diagnosis not present

## 2021-04-24 DIAGNOSIS — H02409 Unspecified ptosis of unspecified eyelid: Secondary | ICD-10-CM | POA: Diagnosis not present

## 2021-04-24 DIAGNOSIS — H18513 Endothelial corneal dystrophy, bilateral: Secondary | ICD-10-CM | POA: Diagnosis not present

## 2021-04-24 DIAGNOSIS — N529 Male erectile dysfunction, unspecified: Secondary | ICD-10-CM | POA: Diagnosis not present

## 2021-04-24 DIAGNOSIS — R82998 Other abnormal findings in urine: Secondary | ICD-10-CM | POA: Diagnosis not present

## 2021-04-24 DIAGNOSIS — H47011 Ischemic optic neuropathy, right eye: Secondary | ICD-10-CM | POA: Diagnosis not present

## 2021-04-24 DIAGNOSIS — R7301 Impaired fasting glucose: Secondary | ICD-10-CM | POA: Diagnosis not present

## 2021-04-24 DIAGNOSIS — E785 Hyperlipidemia, unspecified: Secondary | ICD-10-CM | POA: Diagnosis not present

## 2021-04-24 DIAGNOSIS — Z1389 Encounter for screening for other disorder: Secondary | ICD-10-CM | POA: Diagnosis not present

## 2021-04-24 DIAGNOSIS — M720 Palmar fascial fibromatosis [Dupuytren]: Secondary | ICD-10-CM | POA: Diagnosis not present

## 2021-04-24 DIAGNOSIS — H6122 Impacted cerumen, left ear: Secondary | ICD-10-CM | POA: Diagnosis not present

## 2021-07-05 DIAGNOSIS — Z961 Presence of intraocular lens: Secondary | ICD-10-CM | POA: Diagnosis not present

## 2021-07-05 DIAGNOSIS — Z947 Corneal transplant status: Secondary | ICD-10-CM | POA: Diagnosis not present

## 2021-07-05 DIAGNOSIS — H47011 Ischemic optic neuropathy, right eye: Secondary | ICD-10-CM | POA: Diagnosis not present

## 2021-09-23 DIAGNOSIS — Z23 Encounter for immunization: Secondary | ICD-10-CM | POA: Diagnosis not present

## 2022-05-07 DIAGNOSIS — R7989 Other specified abnormal findings of blood chemistry: Secondary | ICD-10-CM | POA: Diagnosis not present

## 2022-05-07 DIAGNOSIS — Z125 Encounter for screening for malignant neoplasm of prostate: Secondary | ICD-10-CM | POA: Diagnosis not present

## 2022-05-07 DIAGNOSIS — E785 Hyperlipidemia, unspecified: Secondary | ICD-10-CM | POA: Diagnosis not present

## 2022-05-07 DIAGNOSIS — R7301 Impaired fasting glucose: Secondary | ICD-10-CM | POA: Diagnosis not present

## 2022-07-30 DIAGNOSIS — N529 Male erectile dysfunction, unspecified: Secondary | ICD-10-CM | POA: Diagnosis not present

## 2022-07-30 DIAGNOSIS — R7301 Impaired fasting glucose: Secondary | ICD-10-CM | POA: Diagnosis not present

## 2022-07-30 DIAGNOSIS — E785 Hyperlipidemia, unspecified: Secondary | ICD-10-CM | POA: Diagnosis not present

## 2022-07-30 DIAGNOSIS — H47011 Ischemic optic neuropathy, right eye: Secondary | ICD-10-CM | POA: Diagnosis not present

## 2022-07-30 DIAGNOSIS — Z1331 Encounter for screening for depression: Secondary | ICD-10-CM | POA: Diagnosis not present

## 2022-07-30 DIAGNOSIS — M720 Palmar fascial fibromatosis [Dupuytren]: Secondary | ICD-10-CM | POA: Diagnosis not present

## 2022-07-30 DIAGNOSIS — F17200 Nicotine dependence, unspecified, uncomplicated: Secondary | ICD-10-CM | POA: Diagnosis not present

## 2022-07-30 DIAGNOSIS — R82998 Other abnormal findings in urine: Secondary | ICD-10-CM | POA: Diagnosis not present

## 2022-07-30 DIAGNOSIS — Z1339 Encounter for screening examination for other mental health and behavioral disorders: Secondary | ICD-10-CM | POA: Diagnosis not present

## 2022-07-30 DIAGNOSIS — H18513 Endothelial corneal dystrophy, bilateral: Secondary | ICD-10-CM | POA: Diagnosis not present

## 2022-07-30 DIAGNOSIS — Z0000 Encounter for general adult medical examination without abnormal findings: Secondary | ICD-10-CM | POA: Diagnosis not present

## 2022-07-30 DIAGNOSIS — H02409 Unspecified ptosis of unspecified eyelid: Secondary | ICD-10-CM | POA: Diagnosis not present

## 2022-07-31 ENCOUNTER — Other Ambulatory Visit: Payer: Self-pay | Admitting: Internal Medicine

## 2022-07-31 DIAGNOSIS — F17200 Nicotine dependence, unspecified, uncomplicated: Secondary | ICD-10-CM

## 2022-08-31 ENCOUNTER — Ambulatory Visit
Admission: RE | Admit: 2022-08-31 | Discharge: 2022-08-31 | Disposition: A | Discharge status: Home / Self Care | Payer: PPO | Source: Ambulatory Visit | Attending: Internal Medicine | Admitting: Internal Medicine

## 2022-08-31 DIAGNOSIS — F17200 Nicotine dependence, unspecified, uncomplicated: Secondary | ICD-10-CM

## 2022-08-31 DIAGNOSIS — J432 Centrilobular emphysema: Secondary | ICD-10-CM | POA: Diagnosis not present

## 2022-08-31 DIAGNOSIS — I2510 Atherosclerotic heart disease of native coronary artery without angina pectoris: Secondary | ICD-10-CM | POA: Diagnosis not present

## 2022-08-31 DIAGNOSIS — J8410 Pulmonary fibrosis, unspecified: Secondary | ICD-10-CM | POA: Diagnosis not present

## 2022-08-31 DIAGNOSIS — Z87891 Personal history of nicotine dependence: Secondary | ICD-10-CM | POA: Diagnosis not present

## 2022-09-05 DIAGNOSIS — Z947 Corneal transplant status: Secondary | ICD-10-CM | POA: Diagnosis not present

## 2022-09-05 DIAGNOSIS — H47011 Ischemic optic neuropathy, right eye: Secondary | ICD-10-CM | POA: Diagnosis not present

## 2022-09-05 DIAGNOSIS — Z961 Presence of intraocular lens: Secondary | ICD-10-CM | POA: Diagnosis not present

## 2022-10-20 DIAGNOSIS — Z23 Encounter for immunization: Secondary | ICD-10-CM | POA: Diagnosis not present

## 2023-04-12 ENCOUNTER — Encounter: Payer: Self-pay | Admitting: Gastroenterology

## 2023-08-21 DIAGNOSIS — Z125 Encounter for screening for malignant neoplasm of prostate: Secondary | ICD-10-CM | POA: Diagnosis not present

## 2023-08-21 DIAGNOSIS — R7301 Impaired fasting glucose: Secondary | ICD-10-CM | POA: Diagnosis not present

## 2023-08-21 DIAGNOSIS — E785 Hyperlipidemia, unspecified: Secondary | ICD-10-CM | POA: Diagnosis not present

## 2023-08-28 DIAGNOSIS — H02409 Unspecified ptosis of unspecified eyelid: Secondary | ICD-10-CM | POA: Diagnosis not present

## 2023-08-28 DIAGNOSIS — Z86010 Personal history of colonic polyps: Secondary | ICD-10-CM | POA: Diagnosis not present

## 2023-08-28 DIAGNOSIS — H47011 Ischemic optic neuropathy, right eye: Secondary | ICD-10-CM | POA: Diagnosis not present

## 2023-08-28 DIAGNOSIS — J432 Centrilobular emphysema: Secondary | ICD-10-CM | POA: Diagnosis not present

## 2023-08-28 DIAGNOSIS — Z0000 Encounter for general adult medical examination without abnormal findings: Secondary | ICD-10-CM | POA: Diagnosis not present

## 2023-08-28 DIAGNOSIS — R82998 Other abnormal findings in urine: Secondary | ICD-10-CM | POA: Diagnosis not present

## 2023-08-28 DIAGNOSIS — Z23 Encounter for immunization: Secondary | ICD-10-CM | POA: Diagnosis not present

## 2023-08-28 DIAGNOSIS — R7301 Impaired fasting glucose: Secondary | ICD-10-CM | POA: Diagnosis not present

## 2023-08-28 DIAGNOSIS — F17200 Nicotine dependence, unspecified, uncomplicated: Secondary | ICD-10-CM | POA: Diagnosis not present

## 2023-08-28 DIAGNOSIS — Z1339 Encounter for screening examination for other mental health and behavioral disorders: Secondary | ICD-10-CM | POA: Diagnosis not present

## 2023-08-28 DIAGNOSIS — I700 Atherosclerosis of aorta: Secondary | ICD-10-CM | POA: Diagnosis not present

## 2023-08-28 DIAGNOSIS — E785 Hyperlipidemia, unspecified: Secondary | ICD-10-CM | POA: Diagnosis not present

## 2023-08-28 DIAGNOSIS — Z1331 Encounter for screening for depression: Secondary | ICD-10-CM | POA: Diagnosis not present

## 2023-09-16 DIAGNOSIS — F17210 Nicotine dependence, cigarettes, uncomplicated: Secondary | ICD-10-CM | POA: Diagnosis not present

## 2023-10-05 DIAGNOSIS — Z23 Encounter for immunization: Secondary | ICD-10-CM | POA: Diagnosis not present

## 2024-02-19 DIAGNOSIS — L821 Other seborrheic keratosis: Secondary | ICD-10-CM | POA: Diagnosis not present

## 2024-02-19 DIAGNOSIS — L570 Actinic keratosis: Secondary | ICD-10-CM | POA: Diagnosis not present

## 2024-02-19 DIAGNOSIS — B351 Tinea unguium: Secondary | ICD-10-CM | POA: Diagnosis not present

## 2024-02-19 DIAGNOSIS — B353 Tinea pedis: Secondary | ICD-10-CM | POA: Diagnosis not present

## 2024-02-19 DIAGNOSIS — L814 Other melanin hyperpigmentation: Secondary | ICD-10-CM | POA: Diagnosis not present

## 2024-02-19 DIAGNOSIS — D1801 Hemangioma of skin and subcutaneous tissue: Secondary | ICD-10-CM | POA: Diagnosis not present

## 2024-02-19 DIAGNOSIS — L720 Epidermal cyst: Secondary | ICD-10-CM | POA: Diagnosis not present

## 2024-02-27 DIAGNOSIS — B351 Tinea unguium: Secondary | ICD-10-CM | POA: Diagnosis not present

## 2024-03-18 DIAGNOSIS — L602 Onychogryphosis: Secondary | ICD-10-CM | POA: Diagnosis not present

## 2024-08-14 DIAGNOSIS — Z947 Corneal transplant status: Secondary | ICD-10-CM | POA: Diagnosis not present

## 2024-08-14 DIAGNOSIS — Z961 Presence of intraocular lens: Secondary | ICD-10-CM | POA: Diagnosis not present

## 2024-08-14 DIAGNOSIS — H47011 Ischemic optic neuropathy, right eye: Secondary | ICD-10-CM | POA: Diagnosis not present

## 2024-09-11 DIAGNOSIS — Z125 Encounter for screening for malignant neoplasm of prostate: Secondary | ICD-10-CM | POA: Diagnosis not present

## 2024-09-11 DIAGNOSIS — E785 Hyperlipidemia, unspecified: Secondary | ICD-10-CM | POA: Diagnosis not present

## 2024-09-11 DIAGNOSIS — Z0189 Encounter for other specified special examinations: Secondary | ICD-10-CM | POA: Diagnosis not present

## 2024-09-11 DIAGNOSIS — R7301 Impaired fasting glucose: Secondary | ICD-10-CM | POA: Diagnosis not present

## 2024-09-11 DIAGNOSIS — Z79899 Other long term (current) drug therapy: Secondary | ICD-10-CM | POA: Diagnosis not present

## 2024-09-18 DIAGNOSIS — R7301 Impaired fasting glucose: Secondary | ICD-10-CM | POA: Diagnosis not present

## 2024-09-18 DIAGNOSIS — R82998 Other abnormal findings in urine: Secondary | ICD-10-CM | POA: Diagnosis not present

## 2024-09-18 DIAGNOSIS — Z1331 Encounter for screening for depression: Secondary | ICD-10-CM | POA: Diagnosis not present

## 2024-09-18 DIAGNOSIS — J432 Centrilobular emphysema: Secondary | ICD-10-CM | POA: Diagnosis not present

## 2024-09-18 DIAGNOSIS — H02409 Unspecified ptosis of unspecified eyelid: Secondary | ICD-10-CM | POA: Diagnosis not present

## 2024-09-18 DIAGNOSIS — I700 Atherosclerosis of aorta: Secondary | ICD-10-CM | POA: Diagnosis not present

## 2024-09-18 DIAGNOSIS — Z860101 Personal history of adenomatous and serrated colon polyps: Secondary | ICD-10-CM | POA: Diagnosis not present

## 2024-09-18 DIAGNOSIS — E785 Hyperlipidemia, unspecified: Secondary | ICD-10-CM | POA: Diagnosis not present

## 2024-09-18 DIAGNOSIS — F17200 Nicotine dependence, unspecified, uncomplicated: Secondary | ICD-10-CM | POA: Diagnosis not present

## 2024-09-18 DIAGNOSIS — H18513 Endothelial corneal dystrophy, bilateral: Secondary | ICD-10-CM | POA: Diagnosis not present

## 2024-09-18 DIAGNOSIS — H47011 Ischemic optic neuropathy, right eye: Secondary | ICD-10-CM | POA: Diagnosis not present

## 2024-09-18 DIAGNOSIS — Z0000 Encounter for general adult medical examination without abnormal findings: Secondary | ICD-10-CM | POA: Diagnosis not present

## 2024-09-18 DIAGNOSIS — Z1339 Encounter for screening examination for other mental health and behavioral disorders: Secondary | ICD-10-CM | POA: Diagnosis not present

## 2024-09-22 ENCOUNTER — Other Ambulatory Visit: Payer: Self-pay | Admitting: Internal Medicine

## 2024-09-22 DIAGNOSIS — F17200 Nicotine dependence, unspecified, uncomplicated: Secondary | ICD-10-CM

## 2024-10-10 DIAGNOSIS — Z23 Encounter for immunization: Secondary | ICD-10-CM | POA: Diagnosis not present
